# Patient Record
Sex: Female | Born: 1970 | Race: Black or African American | Hispanic: No | Marital: Married
Health system: Southern US, Community
[De-identification: ages and names within clinical notes are randomized; demographics above are authoritative.]

---

## 2015-10-30 ENCOUNTER — Inpatient Hospital Stay
Admission: RE | Admit: 2015-10-30 | Discharge: 2015-12-13 | Disposition: A | Discharge status: Rehabilitation Facility | Payer: Medicare Other | Attending: Internal Medicine | Admitting: Internal Medicine

## 2015-10-30 ENCOUNTER — Other Ambulatory Visit (HOSPITAL_COMMUNITY): Payer: Medicare Other

## 2015-10-30 DIAGNOSIS — Z452 Encounter for adjustment and management of vascular access device: Secondary | ICD-10-CM

## 2015-10-30 DIAGNOSIS — J9690 Respiratory failure, unspecified, unspecified whether with hypoxia or hypercapnia: Secondary | ICD-10-CM

## 2015-10-30 DIAGNOSIS — R509 Fever, unspecified: Secondary | ICD-10-CM

## 2015-10-30 DIAGNOSIS — T17908A Unspecified foreign body in respiratory tract, part unspecified causing other injury, initial encounter: Secondary | ICD-10-CM

## 2015-10-30 DIAGNOSIS — Z431 Encounter for attention to gastrostomy: Secondary | ICD-10-CM

## 2015-10-30 DIAGNOSIS — K567 Ileus, unspecified: Secondary | ICD-10-CM

## 2015-10-30 DIAGNOSIS — R1110 Vomiting, unspecified: Secondary | ICD-10-CM

## 2015-10-30 MED ORDER — DIATRIZOATE MEGLUMINE & SODIUM 66-10 % PO SOLN
30.0000 mL | Freq: Once | ORAL | Status: AC
  Administered 2015-10-30: 30 mL via ORAL

## 2015-10-31 ENCOUNTER — Other Ambulatory Visit (HOSPITAL_COMMUNITY): Payer: Medicare Other

## 2015-10-31 LAB — BLOOD GAS, ARTERIAL
ACID-BASE DEFICIT: 0.4 mmol/L (ref 0.0–2.0)
Acid-base deficit: 1 mmol/L (ref 0.0–2.0)
BICARBONATE: 25 mmol/L (ref 20.0–28.0)
Bicarbonate: 24.9 mmol/L (ref 20.0–28.0)
Drawn by: 290171
FIO2: 30
FIO2: 30
LHR: 14 {breaths}/min
MECHVT: 400 mL
MECHVT: 400 mL
O2 Saturation: 64.3 %
O2 Saturation: 90.5 %
PEEP/CPAP: 5 cmH2O
PEEP: 5 cmH2O
Patient temperature: 97
Patient temperature: 97
RATE: 14 resp/min
pCO2 arterial: 53 mmHg — ABNORMAL HIGH (ref 32.0–48.0)
pCO2 arterial: 48.2 mmHg — ABNORMAL HIGH (ref 32.0–48.0)
pH, Arterial: 7.288 — ABNORMAL LOW (ref 7.350–7.450)
pH, Arterial: 7.33 — ABNORMAL LOW (ref 7.350–7.450)
pO2, Arterial: 37.9 mmHg — CL (ref 83.0–108.0)
pO2, Arterial: 63.8 mmHg — ABNORMAL LOW (ref 83.0–108.0)

## 2015-10-31 LAB — COMPREHENSIVE METABOLIC PANEL
ALBUMIN: 2.7 g/dL — AB (ref 3.5–5.0)
ALK PHOS: 87 U/L (ref 38–126)
ALT: 52 U/L (ref 14–54)
ANION GAP: 11 (ref 5–15)
AST: 35 U/L (ref 15–41)
BUN: 24 mg/dL — ABNORMAL HIGH (ref 6–20)
CALCIUM: 9.1 mg/dL (ref 8.9–10.3)
CO2: 23 mmol/L (ref 22–32)
Chloride: 103 mmol/L (ref 101–111)
Creatinine, Ser: 1.29 mg/dL — ABNORMAL HIGH (ref 0.44–1.00)
GFR calc Af Amer: 57 mL/min — ABNORMAL LOW (ref >60)
GFR calc non Af Amer: 49 mL/min — ABNORMAL LOW (ref >60)
Glucose, Bld: 226 mg/dL — ABNORMAL HIGH (ref 65–99)
Potassium: 5 mmol/L (ref 3.5–5.1)
SODIUM: 137 mmol/L (ref 135–145)
Total Bilirubin: 0.8 mg/dL (ref 0.3–1.2)
Total Protein: 7.6 g/dL (ref 6.5–8.1)

## 2015-10-31 LAB — CBC
HCT: 31.3 % — ABNORMAL LOW (ref 36.0–46.0)
HEMOGLOBIN: 9.2 g/dL — AB (ref 12.0–15.0)
MCH: 25.8 pg — AB (ref 26.0–34.0)
MCHC: 29.4 g/dL — AB (ref 30.0–36.0)
MCV: 87.9 fL (ref 78.0–100.0)
Platelets: 248 10*3/uL (ref 150–400)
RBC: 3.56 MIL/uL — ABNORMAL LOW (ref 3.87–5.11)
RDW: 15.9 % — ABNORMAL HIGH (ref 11.5–15.5)
WBC: 11.5 10*3/uL — ABNORMAL HIGH (ref 4.0–10.5)

## 2015-11-01 LAB — C DIFFICILE QUICK SCREEN W PCR REFLEX
C DIFFICILE (CDIFF) TOXIN: NEGATIVE
C Diff antigen: NEGATIVE
C Diff interpretation: NOT DETECTED

## 2015-11-02 LAB — BASIC METABOLIC PANEL
Anion gap: 5 (ref 5–15)
BUN: 20 mg/dL (ref 6–20)
CALCIUM: 9.3 mg/dL (ref 8.9–10.3)
CHLORIDE: 104 mmol/L (ref 101–111)
CO2: 28 mmol/L (ref 22–32)
CREATININE: 1.12 mg/dL — AB (ref 0.44–1.00)
GFR, EST NON AFRICAN AMERICAN: 58 mL/min — AB (ref >60)
Glucose, Bld: 175 mg/dL — ABNORMAL HIGH (ref 65–99)
Potassium: 5.5 mmol/L — ABNORMAL HIGH (ref 3.5–5.1)
SODIUM: 137 mmol/L (ref 135–145)

## 2015-11-02 LAB — CBC WITH DIFFERENTIAL/PLATELET
BASOS PCT: 1 %
Basophils Absolute: 0 10*3/uL (ref 0.0–0.1)
EOS ABS: 0.2 10*3/uL (ref 0.0–0.7)
Eosinophils Relative: 2 %
HCT: 27.1 % — ABNORMAL LOW (ref 36.0–46.0)
HEMOGLOBIN: 8 g/dL — AB (ref 12.0–15.0)
Lymphocytes Relative: 23 %
Lymphs Abs: 1.5 10*3/uL (ref 0.7–4.0)
MCH: 26.1 pg (ref 26.0–34.0)
MCHC: 29.5 g/dL — AB (ref 30.0–36.0)
MCV: 88.6 fL (ref 78.0–100.0)
MONOS PCT: 7 %
Monocytes Absolute: 0.4 10*3/uL (ref 0.1–1.0)
NEUTROS PCT: 67 %
Neutro Abs: 4.3 10*3/uL (ref 1.7–7.7)
PLATELETS: 168 10*3/uL (ref 150–400)
RBC: 3.06 MIL/uL — ABNORMAL LOW (ref 3.87–5.11)
RDW: 15.8 % — ABNORMAL HIGH (ref 11.5–15.5)
WBC: 6.4 10*3/uL (ref 4.0–10.5)

## 2015-11-03 LAB — BASIC METABOLIC PANEL
Anion gap: 8 (ref 5–15)
BUN: 19 mg/dL (ref 6–20)
CALCIUM: 9.3 mg/dL (ref 8.9–10.3)
CHLORIDE: 102 mmol/L (ref 101–111)
CO2: 26 mmol/L (ref 22–32)
CREATININE: 1.06 mg/dL — AB (ref 0.44–1.00)
GFR calc Af Amer: >60 mL/min (ref >60)
GFR calc non Af Amer: >60 mL/min (ref >60)
GLUCOSE: 234 mg/dL — AB (ref 65–99)
Potassium: 5.8 mmol/L — ABNORMAL HIGH (ref 3.5–5.1)
Sodium: 136 mmol/L (ref 135–145)

## 2015-11-04 LAB — POTASSIUM: Potassium: 4.7 mmol/L (ref 3.5–5.1)

## 2015-11-06 ENCOUNTER — Other Ambulatory Visit (HOSPITAL_COMMUNITY): Payer: Medicare Other

## 2015-11-06 LAB — BASIC METABOLIC PANEL
Anion gap: 10 (ref 5–15)
BUN: 21 mg/dL — AB (ref 6–20)
CO2: 26 mmol/L (ref 22–32)
CREATININE: 1.11 mg/dL — AB (ref 0.44–1.00)
Calcium: 9.3 mg/dL (ref 8.9–10.3)
Chloride: 101 mmol/L (ref 101–111)
GFR, EST NON AFRICAN AMERICAN: 59 mL/min — AB (ref >60)
Glucose, Bld: 160 mg/dL — ABNORMAL HIGH (ref 65–99)
POTASSIUM: 5.4 mmol/L — AB (ref 3.5–5.1)
SODIUM: 137 mmol/L (ref 135–145)

## 2015-11-06 LAB — CBC
HCT: 33.3 % — ABNORMAL LOW (ref 36.0–46.0)
HEMOGLOBIN: 9.8 g/dL — AB (ref 12.0–15.0)
MCH: 26.2 pg (ref 26.0–34.0)
MCHC: 29.4 g/dL — AB (ref 30.0–36.0)
MCV: 89 fL (ref 78.0–100.0)
PLATELETS: 188 10*3/uL (ref 150–400)
RBC: 3.74 MIL/uL — AB (ref 3.87–5.11)
RDW: 15.9 % — ABNORMAL HIGH (ref 11.5–15.5)
WBC: 9.7 10*3/uL (ref 4.0–10.5)

## 2015-11-12 LAB — BASIC METABOLIC PANEL
Anion gap: 13 (ref 5–15)
BUN: 40 mg/dL — AB (ref 6–20)
CALCIUM: 9.6 mg/dL (ref 8.9–10.3)
CO2: 26 mmol/L (ref 22–32)
CREATININE: 1.45 mg/dL — AB (ref 0.44–1.00)
Chloride: 100 mmol/L — ABNORMAL LOW (ref 101–111)
GFR calc Af Amer: 50 mL/min — ABNORMAL LOW (ref >60)
GFR, EST NON AFRICAN AMERICAN: 43 mL/min — AB (ref >60)
GLUCOSE: 109 mg/dL — AB (ref 65–99)
Potassium: 4.9 mmol/L (ref 3.5–5.1)
SODIUM: 139 mmol/L (ref 135–145)

## 2015-11-12 LAB — CBC
HCT: 35.4 % — ABNORMAL LOW (ref 36.0–46.0)
Hemoglobin: 10.5 g/dL — ABNORMAL LOW (ref 12.0–15.0)
MCH: 25.7 pg — AB (ref 26.0–34.0)
MCHC: 29.7 g/dL — AB (ref 30.0–36.0)
MCV: 86.6 fL (ref 78.0–100.0)
PLATELETS: 225 10*3/uL (ref 150–400)
RBC: 4.09 MIL/uL (ref 3.87–5.11)
RDW: 15.6 % — AB (ref 11.5–15.5)
WBC: 7.5 10*3/uL (ref 4.0–10.5)

## 2015-11-15 LAB — URINALYSIS, ROUTINE W REFLEX MICROSCOPIC
Bilirubin Urine: NEGATIVE
Glucose, UA: NEGATIVE mg/dL
HGB URINE DIPSTICK: NEGATIVE
Ketones, ur: NEGATIVE mg/dL
Nitrite: NEGATIVE
Protein, ur: 30 mg/dL — AB
SPECIFIC GRAVITY, URINE: 1.017 (ref 1.005–1.030)
pH: 6 (ref 5.0–8.0)

## 2015-11-15 LAB — URINE MICROSCOPIC-ADD ON

## 2015-11-16 LAB — BASIC METABOLIC PANEL
Anion gap: 10 (ref 5–15)
BUN: 40 mg/dL — AB (ref 6–20)
CHLORIDE: 101 mmol/L (ref 101–111)
CO2: 30 mmol/L (ref 22–32)
CREATININE: 1.39 mg/dL — AB (ref 0.44–1.00)
Calcium: 9.5 mg/dL (ref 8.9–10.3)
GFR calc Af Amer: 52 mL/min — ABNORMAL LOW (ref >60)
GFR calc non Af Amer: 45 mL/min — ABNORMAL LOW (ref >60)
Glucose, Bld: 174 mg/dL — ABNORMAL HIGH (ref 65–99)
Potassium: 4.7 mmol/L (ref 3.5–5.1)
SODIUM: 141 mmol/L (ref 135–145)

## 2015-11-16 LAB — CBC
HCT: 32.6 % — ABNORMAL LOW (ref 36.0–46.0)
Hemoglobin: 9.7 g/dL — ABNORMAL LOW (ref 12.0–15.0)
MCH: 25.9 pg — AB (ref 26.0–34.0)
MCHC: 29.8 g/dL — ABNORMAL LOW (ref 30.0–36.0)
MCV: 87.2 fL (ref 78.0–100.0)
PLATELETS: 192 10*3/uL (ref 150–400)
RBC: 3.74 MIL/uL — ABNORMAL LOW (ref 3.87–5.11)
RDW: 15.2 % (ref 11.5–15.5)
WBC: 8.2 10*3/uL (ref 4.0–10.5)

## 2015-11-20 ENCOUNTER — Other Ambulatory Visit (HOSPITAL_COMMUNITY): Payer: Medicare Other

## 2015-11-21 LAB — BASIC METABOLIC PANEL WITH GFR
Anion gap: 10 (ref 5–15)
BUN: 39 mg/dL — ABNORMAL HIGH (ref 6–20)
CO2: 31 mmol/L (ref 22–32)
Calcium: 9.6 mg/dL (ref 8.9–10.3)
Chloride: 103 mmol/L (ref 101–111)
Creatinine, Ser: 1.59 mg/dL — ABNORMAL HIGH (ref 0.44–1.00)
GFR calc Af Amer: 44 mL/min — ABNORMAL LOW
GFR calc non Af Amer: 38 mL/min — ABNORMAL LOW
Glucose, Bld: 105 mg/dL — ABNORMAL HIGH (ref 65–99)
Potassium: 5.1 mmol/L (ref 3.5–5.1)
Sodium: 144 mmol/L (ref 135–145)

## 2015-11-21 LAB — CBC
HCT: 37 % (ref 36.0–46.0)
HEMOGLOBIN: 10.7 g/dL — AB (ref 12.0–15.0)
MCH: 25.5 pg — AB (ref 26.0–34.0)
MCHC: 28.9 g/dL — ABNORMAL LOW (ref 30.0–36.0)
MCV: 88.1 fL (ref 78.0–100.0)
Platelets: 188 10*3/uL (ref 150–400)
RBC: 4.2 MIL/uL (ref 3.87–5.11)
RDW: 15.5 % (ref 11.5–15.5)
WBC: 8.3 10*3/uL (ref 4.0–10.5)

## 2015-11-24 ENCOUNTER — Other Ambulatory Visit (HOSPITAL_COMMUNITY): Payer: Medicare Other

## 2015-11-24 LAB — URINALYSIS, ROUTINE W REFLEX MICROSCOPIC
Bilirubin Urine: NEGATIVE
GLUCOSE, UA: NEGATIVE mg/dL
Hgb urine dipstick: NEGATIVE
Ketones, ur: NEGATIVE mg/dL
NITRITE: NEGATIVE
PROTEIN: 100 mg/dL — AB
Specific Gravity, Urine: 1.019 (ref 1.005–1.030)
pH: 6.5 (ref 5.0–8.0)

## 2015-11-24 LAB — BASIC METABOLIC PANEL
ANION GAP: 8 (ref 5–15)
BUN: 45 mg/dL — ABNORMAL HIGH (ref 6–20)
CHLORIDE: 103 mmol/L (ref 101–111)
CO2: 33 mmol/L — AB (ref 22–32)
CREATININE: 1.78 mg/dL — AB (ref 0.44–1.00)
Calcium: 9.8 mg/dL (ref 8.9–10.3)
GFR calc non Af Amer: 33 mL/min — ABNORMAL LOW (ref >60)
GFR, EST AFRICAN AMERICAN: 39 mL/min — AB (ref >60)
Glucose, Bld: 241 mg/dL — ABNORMAL HIGH (ref 65–99)
POTASSIUM: 6.2 mmol/L — AB (ref 3.5–5.1)
Sodium: 144 mmol/L (ref 135–145)

## 2015-11-24 LAB — CBC
HEMATOCRIT: 35.7 % — AB (ref 36.0–46.0)
HEMOGLOBIN: 10.6 g/dL — AB (ref 12.0–15.0)
MCH: 26.4 pg (ref 26.0–34.0)
MCHC: 29.7 g/dL — AB (ref 30.0–36.0)
MCV: 88.8 fL (ref 78.0–100.0)
Platelets: 208 10*3/uL (ref 150–400)
RBC: 4.02 MIL/uL (ref 3.87–5.11)
RDW: 16 % — ABNORMAL HIGH (ref 11.5–15.5)
WBC: 11.5 10*3/uL — ABNORMAL HIGH (ref 4.0–10.5)

## 2015-11-24 LAB — URINE MICROSCOPIC-ADD ON

## 2015-11-24 LAB — PROCALCITONIN

## 2015-11-25 LAB — BASIC METABOLIC PANEL
Anion gap: 13 (ref 5–15)
BUN: 43 mg/dL — AB (ref 6–20)
CALCIUM: 9.5 mg/dL (ref 8.9–10.3)
CO2: 26 mmol/L (ref 22–32)
CREATININE: 1.45 mg/dL — AB (ref 0.44–1.00)
Chloride: 106 mmol/L (ref 101–111)
GFR calc non Af Amer: 43 mL/min — ABNORMAL LOW (ref >60)
GFR, EST AFRICAN AMERICAN: 50 mL/min — AB (ref >60)
Glucose, Bld: 202 mg/dL — ABNORMAL HIGH (ref 65–99)
Potassium: 5 mmol/L (ref 3.5–5.1)
SODIUM: 145 mmol/L (ref 135–145)

## 2015-11-26 ENCOUNTER — Other Ambulatory Visit (HOSPITAL_COMMUNITY): Payer: Medicare Other

## 2015-11-26 LAB — URINE CULTURE

## 2015-11-26 LAB — PROCALCITONIN

## 2015-11-28 ENCOUNTER — Other Ambulatory Visit (HOSPITAL_COMMUNITY): Payer: Medicare Other

## 2015-11-28 LAB — URINE MICROSCOPIC-ADD ON: RBC / HPF: NONE SEEN RBC/hpf (ref 0–5)

## 2015-11-28 LAB — URINALYSIS, ROUTINE W REFLEX MICROSCOPIC
BILIRUBIN URINE: NEGATIVE
Glucose, UA: NEGATIVE mg/dL
Hgb urine dipstick: NEGATIVE
Ketones, ur: NEGATIVE mg/dL
NITRITE: NEGATIVE
PH: 5 (ref 5.0–8.0)
Protein, ur: 100 mg/dL — AB
SPECIFIC GRAVITY, URINE: 1.021 (ref 1.005–1.030)

## 2015-11-28 LAB — PROCALCITONIN

## 2015-11-29 LAB — CBC WITH DIFFERENTIAL/PLATELET
Basophils Absolute: 0.1 10*3/uL (ref 0.0–0.1)
Basophils Relative: 1 %
EOS PCT: 4 %
Eosinophils Absolute: 0.4 10*3/uL (ref 0.0–0.7)
HCT: 35.5 % — ABNORMAL LOW (ref 36.0–46.0)
HEMOGLOBIN: 10.3 g/dL — AB (ref 12.0–15.0)
LYMPHS PCT: 38 %
Lymphs Abs: 3.5 10*3/uL (ref 0.7–4.0)
MCH: 25.8 pg — AB (ref 26.0–34.0)
MCHC: 29 g/dL — AB (ref 30.0–36.0)
MCV: 88.8 fL (ref 78.0–100.0)
MONOS PCT: 7 %
Monocytes Absolute: 0.6 10*3/uL (ref 0.1–1.0)
NEUTROS PCT: 51 %
Neutro Abs: 4.8 10*3/uL (ref 1.7–7.7)
Platelets: 208 10*3/uL (ref 150–400)
RBC: 4 MIL/uL (ref 3.87–5.11)
RDW: 16.3 % — ABNORMAL HIGH (ref 11.5–15.5)
WBC: 9.3 10*3/uL (ref 4.0–10.5)

## 2015-11-29 LAB — BLOOD CULTURE ID PANEL (REFLEXED)
Acinetobacter baumannii: NOT DETECTED
CANDIDA GLABRATA: NOT DETECTED
CANDIDA KRUSEI: NOT DETECTED
CANDIDA PARAPSILOSIS: NOT DETECTED
CANDIDA TROPICALIS: NOT DETECTED
Candida albicans: NOT DETECTED
ENTEROBACTER CLOACAE COMPLEX: NOT DETECTED
ESCHERICHIA COLI: NOT DETECTED
Enterobacteriaceae species: NOT DETECTED
Enterococcus species: NOT DETECTED
Haemophilus influenzae: NOT DETECTED
KLEBSIELLA PNEUMONIAE: NOT DETECTED
Klebsiella oxytoca: NOT DETECTED
Listeria monocytogenes: NOT DETECTED
Methicillin resistance: DETECTED — AB
Neisseria meningitidis: NOT DETECTED
PROTEUS SPECIES: NOT DETECTED
Pseudomonas aeruginosa: NOT DETECTED
SERRATIA MARCESCENS: NOT DETECTED
STAPHYLOCOCCUS SPECIES: DETECTED — AB
Staphylococcus aureus (BCID): NOT DETECTED
Streptococcus agalactiae: NOT DETECTED
Streptococcus pneumoniae: NOT DETECTED
Streptococcus pyogenes: NOT DETECTED
Streptococcus species: NOT DETECTED

## 2015-11-29 LAB — URINE CULTURE: Culture: NO GROWTH

## 2015-11-30 LAB — VANCOMYCIN, TROUGH: VANCOMYCIN TR: 21 ug/mL — AB (ref 15–20)

## 2015-12-01 ENCOUNTER — Other Ambulatory Visit (HOSPITAL_COMMUNITY): Payer: Medicare Other

## 2015-12-01 LAB — CULTURE, BLOOD (ROUTINE X 2)

## 2015-12-02 LAB — CULTURE, RESPIRATORY

## 2015-12-02 LAB — CULTURE, RESPIRATORY W GRAM STAIN

## 2015-12-02 LAB — BLOOD CULTURE ID PANEL (REFLEXED)
Acinetobacter baumannii: NOT DETECTED
CANDIDA ALBICANS: NOT DETECTED
CANDIDA GLABRATA: NOT DETECTED
CANDIDA PARAPSILOSIS: NOT DETECTED
CANDIDA TROPICALIS: NOT DETECTED
Candida krusei: NOT DETECTED
ENTEROBACTER CLOACAE COMPLEX: NOT DETECTED
ENTEROBACTERIACEAE SPECIES: NOT DETECTED
Enterococcus species: NOT DETECTED
Escherichia coli: NOT DETECTED
Haemophilus influenzae: NOT DETECTED
KLEBSIELLA OXYTOCA: NOT DETECTED
KLEBSIELLA PNEUMONIAE: NOT DETECTED
Listeria monocytogenes: NOT DETECTED
Methicillin resistance: DETECTED — AB
Neisseria meningitidis: NOT DETECTED
PROTEUS SPECIES: NOT DETECTED
Pseudomonas aeruginosa: NOT DETECTED
STAPHYLOCOCCUS SPECIES: DETECTED — AB
STREPTOCOCCUS PYOGENES: NOT DETECTED
Serratia marcescens: NOT DETECTED
Staphylococcus aureus (BCID): NOT DETECTED
Streptococcus agalactiae: NOT DETECTED
Streptococcus pneumoniae: NOT DETECTED
Streptococcus species: NOT DETECTED

## 2015-12-03 ENCOUNTER — Ambulatory Visit (HOSPITAL_COMMUNITY): Payer: Medicare Other

## 2015-12-03 LAB — CULTURE, BLOOD (ROUTINE X 2)

## 2015-12-03 LAB — VANCOMYCIN, TROUGH: Vancomycin Tr: 37 ug/mL (ref 15–20)

## 2015-12-04 ENCOUNTER — Other Ambulatory Visit (HOSPITAL_BASED_OUTPATIENT_CLINIC_OR_DEPARTMENT_OTHER): Payer: Medicare Other

## 2015-12-04 DIAGNOSIS — I339 Acute and subacute endocarditis, unspecified: Secondary | ICD-10-CM | POA: Diagnosis not present

## 2015-12-04 LAB — CBC
HEMATOCRIT: 34.4 % — AB (ref 36.0–46.0)
HEMOGLOBIN: 10.1 g/dL — AB (ref 12.0–15.0)
MCH: 26.2 pg (ref 26.0–34.0)
MCHC: 29.4 g/dL — AB (ref 30.0–36.0)
MCV: 89.4 fL (ref 78.0–100.0)
Platelets: 178 10*3/uL (ref 150–400)
RBC: 3.85 MIL/uL — ABNORMAL LOW (ref 3.87–5.11)
RDW: 17.1 % — ABNORMAL HIGH (ref 11.5–15.5)
WBC: 10.7 10*3/uL — ABNORMAL HIGH (ref 4.0–10.5)

## 2015-12-04 LAB — BASIC METABOLIC PANEL
Anion gap: 8 (ref 5–15)
BUN: 52 mg/dL — AB (ref 6–20)
CHLORIDE: 106 mmol/L (ref 101–111)
CO2: 29 mmol/L (ref 22–32)
CREATININE: 1.47 mg/dL — AB (ref 0.44–1.00)
Calcium: 9.4 mg/dL (ref 8.9–10.3)
GFR calc non Af Amer: 42 mL/min — ABNORMAL LOW (ref >60)
GFR, EST AFRICAN AMERICAN: 49 mL/min — AB (ref >60)
Glucose, Bld: 167 mg/dL — ABNORMAL HIGH (ref 65–99)
Potassium: 4.8 mmol/L (ref 3.5–5.1)
Sodium: 143 mmol/L (ref 135–145)

## 2015-12-04 LAB — VANCOMYCIN, TROUGH: Vancomycin Tr: 20 ug/mL (ref 15–20)

## 2015-12-04 NOTE — Progress Notes (Signed)
  Echocardiogram 2D Echocardiogram limited has been performed.  Nolon RodBrown, Kim Barron, Kim Barron

## 2015-12-06 LAB — CULTURE, BLOOD (ROUTINE X 2): Culture: NO GROWTH

## 2015-12-07 LAB — CBC WITH DIFFERENTIAL/PLATELET
BASOS PCT: 1 %
Basophils Absolute: 0.1 10*3/uL (ref 0.0–0.1)
Eosinophils Absolute: 0.6 10*3/uL (ref 0.0–0.7)
Eosinophils Relative: 5 %
HCT: 35.2 % — ABNORMAL LOW (ref 36.0–46.0)
HEMOGLOBIN: 10.4 g/dL — AB (ref 12.0–15.0)
LYMPHS PCT: 31 %
Lymphs Abs: 4 10*3/uL (ref 0.7–4.0)
MCH: 26.9 pg (ref 26.0–34.0)
MCHC: 29.5 g/dL — ABNORMAL LOW (ref 30.0–36.0)
MCV: 91 fL (ref 78.0–100.0)
MONOS PCT: 8 %
Monocytes Absolute: 1 10*3/uL (ref 0.1–1.0)
NEUTROS ABS: 7.1 10*3/uL (ref 1.7–7.7)
Neutrophils Relative %: 55 %
Platelets: 233 10*3/uL (ref 150–400)
RBC: 3.87 MIL/uL (ref 3.87–5.11)
RDW: 16.8 % — ABNORMAL HIGH (ref 11.5–15.5)
WBC: 12.8 10*3/uL — ABNORMAL HIGH (ref 4.0–10.5)

## 2015-12-07 LAB — BASIC METABOLIC PANEL
ANION GAP: 8 (ref 5–15)
BUN: 50 mg/dL — AB (ref 6–20)
CHLORIDE: 109 mmol/L (ref 101–111)
CO2: 30 mmol/L (ref 22–32)
Calcium: 9.4 mg/dL (ref 8.9–10.3)
Creatinine, Ser: 1.61 mg/dL — ABNORMAL HIGH (ref 0.44–1.00)
GFR calc Af Amer: 44 mL/min — ABNORMAL LOW (ref >60)
GFR calc non Af Amer: 38 mL/min — ABNORMAL LOW (ref >60)
GLUCOSE: 147 mg/dL — AB (ref 65–99)
POTASSIUM: 5.3 mmol/L — AB (ref 3.5–5.1)
SODIUM: 147 mmol/L — AB (ref 135–145)

## 2015-12-08 LAB — BASIC METABOLIC PANEL
ANION GAP: 8 (ref 5–15)
BUN: 50 mg/dL — AB (ref 6–20)
CHLORIDE: 103 mmol/L (ref 101–111)
CO2: 29 mmol/L (ref 22–32)
Calcium: 8.7 mg/dL — ABNORMAL LOW (ref 8.9–10.3)
Creatinine, Ser: 1.59 mg/dL — ABNORMAL HIGH (ref 0.44–1.00)
GFR, EST AFRICAN AMERICAN: 44 mL/min — AB (ref >60)
GFR, EST NON AFRICAN AMERICAN: 38 mL/min — AB (ref >60)
Glucose, Bld: 295 mg/dL — ABNORMAL HIGH (ref 65–99)
Potassium: 4.9 mmol/L (ref 3.5–5.1)
SODIUM: 140 mmol/L (ref 135–145)

## 2015-12-08 LAB — VANCOMYCIN, TROUGH: VANCOMYCIN TR: 23 ug/mL — AB (ref 15–20)

## 2015-12-09 LAB — CULTURE, BLOOD (ROUTINE X 2)
CULTURE: NO GROWTH
CULTURE: NO GROWTH

## 2015-12-09 LAB — VANCOMYCIN, TROUGH: Vancomycin Tr: 15 ug/mL (ref 15–20)

## 2015-12-10 ENCOUNTER — Other Ambulatory Visit: Payer: Self-pay | Admitting: Physician Assistant

## 2015-12-10 NOTE — Progress Notes (Signed)
    [facility] has been requested to perform a transesophageal echocardiogram on Kim Barron for bacteremia and abnormality on AV on TTE scheduled for 12/11/15 @ 3pm .  After careful review of history and examination, the risks and benefits of transesophageal echocardiogram have been explained including risks of esophageal damage, perforation (1:10,000 risk), bleeding, pharyngeal hematoma as well as other potential complications associated with conscious sedation including aspiration, arrhythmia, respiratory failure and death. Alternatives to treatment were discussed, questions were answered. Discssed with her husband Kim Barron and he is willing to proceed.   Cline CrockKathryn Connor Foxworthy, PA-C  12/10/2015 12:27 PM

## 2015-12-11 ENCOUNTER — Other Ambulatory Visit (HOSPITAL_COMMUNITY): Payer: Medicare Other

## 2015-12-11 ENCOUNTER — Encounter
Admission: RE | Disposition: A | Discharge status: Rehabilitation Facility | Payer: Self-pay | Source: Home / Self Care | Attending: Internal Medicine

## 2015-12-11 ENCOUNTER — Other Ambulatory Visit (HOSPITAL_BASED_OUTPATIENT_CLINIC_OR_DEPARTMENT_OTHER): Payer: Medicare Other

## 2015-12-11 DIAGNOSIS — R7881 Bacteremia: Secondary | ICD-10-CM

## 2015-12-11 LAB — BASIC METABOLIC PANEL
Anion gap: 8 (ref 5–15)
BUN: 43 mg/dL — AB (ref 6–20)
CALCIUM: 9.2 mg/dL (ref 8.9–10.3)
CHLORIDE: 102 mmol/L (ref 101–111)
CO2: 30 mmol/L (ref 22–32)
CREATININE: 1.41 mg/dL — AB (ref 0.44–1.00)
GFR, EST AFRICAN AMERICAN: 51 mL/min — AB (ref >60)
GFR, EST NON AFRICAN AMERICAN: 44 mL/min — AB (ref >60)
Glucose, Bld: 139 mg/dL — ABNORMAL HIGH (ref 65–99)
Potassium: 4.3 mmol/L (ref 3.5–5.1)
SODIUM: 140 mmol/L (ref 135–145)

## 2015-12-11 LAB — CBC
HCT: 32.1 % — ABNORMAL LOW (ref 36.0–46.0)
HEMOGLOBIN: 9.7 g/dL — AB (ref 12.0–15.0)
MCH: 26.1 pg (ref 26.0–34.0)
MCHC: 30.2 g/dL (ref 30.0–36.0)
MCV: 86.5 fL (ref 78.0–100.0)
PLATELETS: 160 10*3/uL (ref 150–400)
RBC: 3.71 MIL/uL — ABNORMAL LOW (ref 3.87–5.11)
RDW: 16.4 % — ABNORMAL HIGH (ref 11.5–15.5)
WBC: 9 10*3/uL (ref 4.0–10.5)

## 2015-12-11 LAB — PROTIME-INR
INR: 1.02
PROTHROMBIN TIME: 13.4 s (ref 11.4–15.2)

## 2015-12-11 SURGERY — ECHOCARDIOGRAM, TRANSESOPHAGEAL
Anesthesia: Moderate Sedation

## 2015-12-11 NOTE — CV Procedure (Signed)
Brief TEE Note  LVEF 55-60% No LA or LAA thrombus or mass There was moderate thickening and calcification of the non-coronary cusp of the aortic valve.  There were no clear vegetations, no aortic stenosis or regurgitation.  For additional details see full report.  During this procedure the patient is administered a total of Versed 3 mg and Fentanyl 75 mcg to achieve and maintain moderate conscious sedation.  The patient's heart rate, blood pressure, and oxygen saturation are monitored continuously during the procedure. The period of conscious sedation is 20 minutes, of which I was present face-to-face 100% of this time.   Teniola Tseng C. Duke Salviaandolph, MD, [facility] CenterFACC  12/11/2015  4:40 PM

## 2015-12-11 NOTE — H&P (Signed)
Patient ID: Kim Barron MRN: [id number], DOB/AGE: 45/01/1970   Admit date: 10/30/2015  Requesting Physician: Dr. Sharyon MedicusHijazi Primary Physician: No primary care provider on file. Primary Cardiologist: None Reason for admission: anoxic brain injury- cardiology consulted for TEE for possible endocarditis  Pt. Profile:  Kim Barron is a 45 y.o. female with a history of DiGeorge syndrome, diabetes mellitus, morbid obesity, hypertension, OSA who was found unresponsive in the setting of severe hypoglycemia and acute respiratory failure with a question of anoxic brain injury.  The patient was treated for Klebsiella/Serratia pneumonia then treated for aspiration pneumonia she continued to be encephalopathic and developed a low-grade fever she was transferred to evaluation and treatment.  He has been ventilated and getting tube feeds while here. She's developed MRSA bacteremia nd has been treated with vancomycin and Zosyn. Transthoracic echocardiogram on 11/30 showed normal LV systolic function with a thickened non-coronary cusp of the aortic valve that was suspicious for endocarditis. Cardiology was consult for TEE to rule out endocarditis.    Problem List  No past medical history on file.  No past surgical history on file.   Allergies  Allergies not on file   Home Medications  Prior to Admission medications   Not on File    Family History  No family history on file.   No family history able to be obtained since patient is not alert and oriented No family status information on file.     Social History  Social History   Social History  . Marital status: Married    Spouse name: N/A  . Number of children: N/A  . Years of education: N/A   Occupational History  . Not on file.   Social History Main Topics  . Smoking status: Not on file  . Smokeless tobacco: Not on file  . Alcohol use Not on file  . Drug use: Unknown  . Sexual activity: Not on file   Other Topics  Concern  . Not on file   Social History Narrative  . No narrative on file     Review of Systems General:  No chills, fever, night sweats or weight changes.  Cardiovascular:  No chest pain, dyspnea on exertion, edema, orthopnea, palpitations, paroxysmal nocturnal dyspnea. Dermatological: No rash, lesions/masses Respiratory: No cough, dyspnea Urologic: No hematuria, dysuria Abdominal:   No nausea, vomiting, diarrhea, bright red blood per rectum, melena, or hematemesis Neurologic:  No visual changes, wkns, changes in mental status. All other systems reviewed and are otherwise negative except as noted above.  Physical Exam  There were no vitals taken for this visit.  General: intubated and sedated Psych: Normal affect. Neuro: Alert and oriented X 3. Moves all extremities spontaneously. HEENT: Normal  Neck: Supple without bruits or JVD. Lungs:  Resp regular and unlabored, CTA. Heart: RRR no s3, s4, or murmurs. Abdomen: Soft, non-tender, non-distended, BS + x 4.  Extremities: No clubbing, cyanosis or edema. DP/PT/Radials 2+ and equal bilaterally.  Labs  No results for input(s): CKTOTAL, CKMB, TROPONINI in the last 72 hours. Lab Results  Component Value Date   WBC 9.0 12/11/2015   HGB 9.7 (L) 12/11/2015   HCT 32.1 (L) 12/11/2015   MCV 86.5 12/11/2015   PLT 160 12/11/2015    Recent Labs Lab 12/11/15 0640  NA 140  K 4.3  CL 102  CO2 30  BUN 43*  CREATININE 1.41*  CALCIUM 9.2  GLUCOSE 139*   No results found for: CHOL, HDL, LDLCALC, TRIG  No results found for: DDIMER   Radiology/Studies  Dg Abd 1 View  Result Date: 11/20/2015 CLINICAL DATA:  Vomiting EXAM: ABDOMEN - 1 VIEW COMPARISON:  10/30/2015 FINDINGS: Gastrostomy tube overlying the stomach. Nonobstructive bowel gas pattern. Visualized osseous structures are within normal limits. IMPRESSION: Gastrostomy tube overlying the stomach. Nonobstructive bowel gas pattern. Electronically Signed   By: Charline BillsSriyesh  Krishnan  M.D.   On: 11/20/2015 10:54   Dg Chest Port 1 View  Result Date: 12/11/2015 CLINICAL DATA:  Respiratory failure, hypoxia EXAM: PORTABLE CHEST 1 VIEW COMPARISON:  Portable chest x-ray of 12/01/2015 and 11/28/2015 FINDINGS: The lungs are not as well aerated with basilar volume loss. Linear atelectasis or scarring is noted in the left mid lung. Mild pulmonary vascular congestion cannot be excluded. The heart is mildly enlarged. Left PICC line tip overlies the lower SVC and tracheostomy is noted. IMPRESSION: 1. Diminished aeration with increasing atelectasis bilaterally. 2. Cardiomegaly.  Cannot exclude mild pulmonary vascular congestion. 3. No change in tracheostomy and left PICC line. Electronically Signed   By: Dwyane DeePaul  Barry M.D.   On: 12/11/2015 08:07   Dg Chest Port 1 View  Result Date: 11/28/2015 CLINICAL DATA:  Fever EXAM: PORTABLE CHEST 1 VIEW COMPARISON:  11/24/2015 FINDINGS: A tracheostomy tube remains in place. Subsegmental atelectasis in the left upper lobe unchanged. Small focal opacity medial left lung base, atelectasis versus mild infiltrate. No effusion. Stable moderate cardiomegaly without overt failure. IMPRESSION: 1. Cardiomegaly without overt failure 2. Small focal opacity in the medial left lung base could reflect atelectasis or a small infiltrate. 3. Stable subsegmental atelectasis in the left upper lung. Electronically Signed   By: Jasmine PangKim  Fujinaga M.D.   On: 11/28/2015 15:09   Dg Chest Port 1 View  Result Date: 11/24/2015 CLINICAL DATA:  Respiratory failure. EXAM: PORTABLE CHEST 1 VIEW COMPARISON:  11/20/2015 . FINDINGS: Tracheostomy tube in stable position. Cardiomegaly. Left mid lung field subsegmental atelectasis. Low lung volumes. Very mild interstitial prominence noted. Very mild component congestive heart failure cannot be excluded. No pleural effusion or pneumothorax . IMPRESSION: 1. Tracheostomy tube in stable position. 2. Left mid lung field subsegmental atelectasis. 3.  Persistent cardiomegaly. Interim slight increase in interstitial prominence suggesting mild congestive heart failure. Electronically Signed   By: Maisie Fushomas  Register   On: 11/24/2015 07:43   Dg Chest Port 1 View  Result Date: 11/20/2015 CLINICAL DATA:  Tracheostomy EXAM: PORTABLE CHEST 1 VIEW COMPARISON:  11/06/2015 FINDINGS: Tracheostomy in satisfactory position. Mild platelike opacity in the left upper lobe, likely atelectasis. Mild patchy lateral right lower lobe opacity, atelectasis versus pneumonia. No pleural effusion or pneumothorax. Cardiomegaly. IMPRESSION: Mild patchy right lower lobe opacity, atelectasis versus pneumonia. Mild platelike opacity in the left upper lobe, likely atelectasis. Tracheostomy in satisfactory position. Electronically Signed   By: Charline BillsSriyesh  Krishnan M.D.   On: 11/20/2015 11:20   Dg Chest Port 1v Same Day  Result Date: 12/01/2015 CLINICAL DATA:  PICC line placement EXAM: PORTABLE CHEST 1 VIEW COMPARISON:  11/28/2015 FINDINGS: AP portable view of the chest demonstrates a tracheostomy tube. There is interim insertion of a left upper extremity catheter, the tip overlies the expected location of cavoatrial junction. There are low lung volumes augments Ing the cardiomediastinal silhouette and central bronchovascular structures. Perihilar opacities persist allowing for low lung volume. Heart appears enlarged. Discoid atelectasis in the left upper lobe is unchanged. No effusion. IMPRESSION: 1. Left upper extremity catheter tip projects over expected location of cavoatrial junction. 2. Low lung volumes. Cardiomegaly with perihilar  opacities, not significantly changed. Electronically Signed   By: Jasmine Pang M.D.   On: 12/01/2015 23:32   Dg Abd Portable 1v  Result Date: 11/26/2015 CLINICAL DATA:  Vomiting. EXAM: PORTABLE ABDOMEN - 1 VIEW COMPARISON:  None. FINDINGS: The bowel gas pattern is normal. No radio-opaque calculi or other significant radiographic abnormality are seen.  IMPRESSION: No evidence of bowel obstruction or ileus. Electronically Signed   By: Lupita Raider, M.D.   On: 11/26/2015 13:50       ASSESSMENT AND PLAN  Bacteremia with aortic valve abnormality on TTE: plan will be to do a TEE today to rule out endocarditis to determine length of antibiotic treatment. She would not be a surgical candidate given that she has no meaningful neurologic activity at this time.   Signed, Cline Crock, PA-C 12/11/2015, 3:15 PM  Pager [phone]

## 2015-12-12 LAB — VANCOMYCIN, TROUGH: Vancomycin Tr: 23 ug/mL (ref 15–20)

## 2016-02-05 DEATH — deceased

## 2018-09-15 IMAGING — CR DG ABDOMEN 1V
1 series · 1 of 1 positions shown · non-contrast
Comparison: None.

CLINICAL DATA: Peg tube placement with 30 cc of Gastrografin

EXAM:
ABDOMEN - 1 VIEW

[AP]
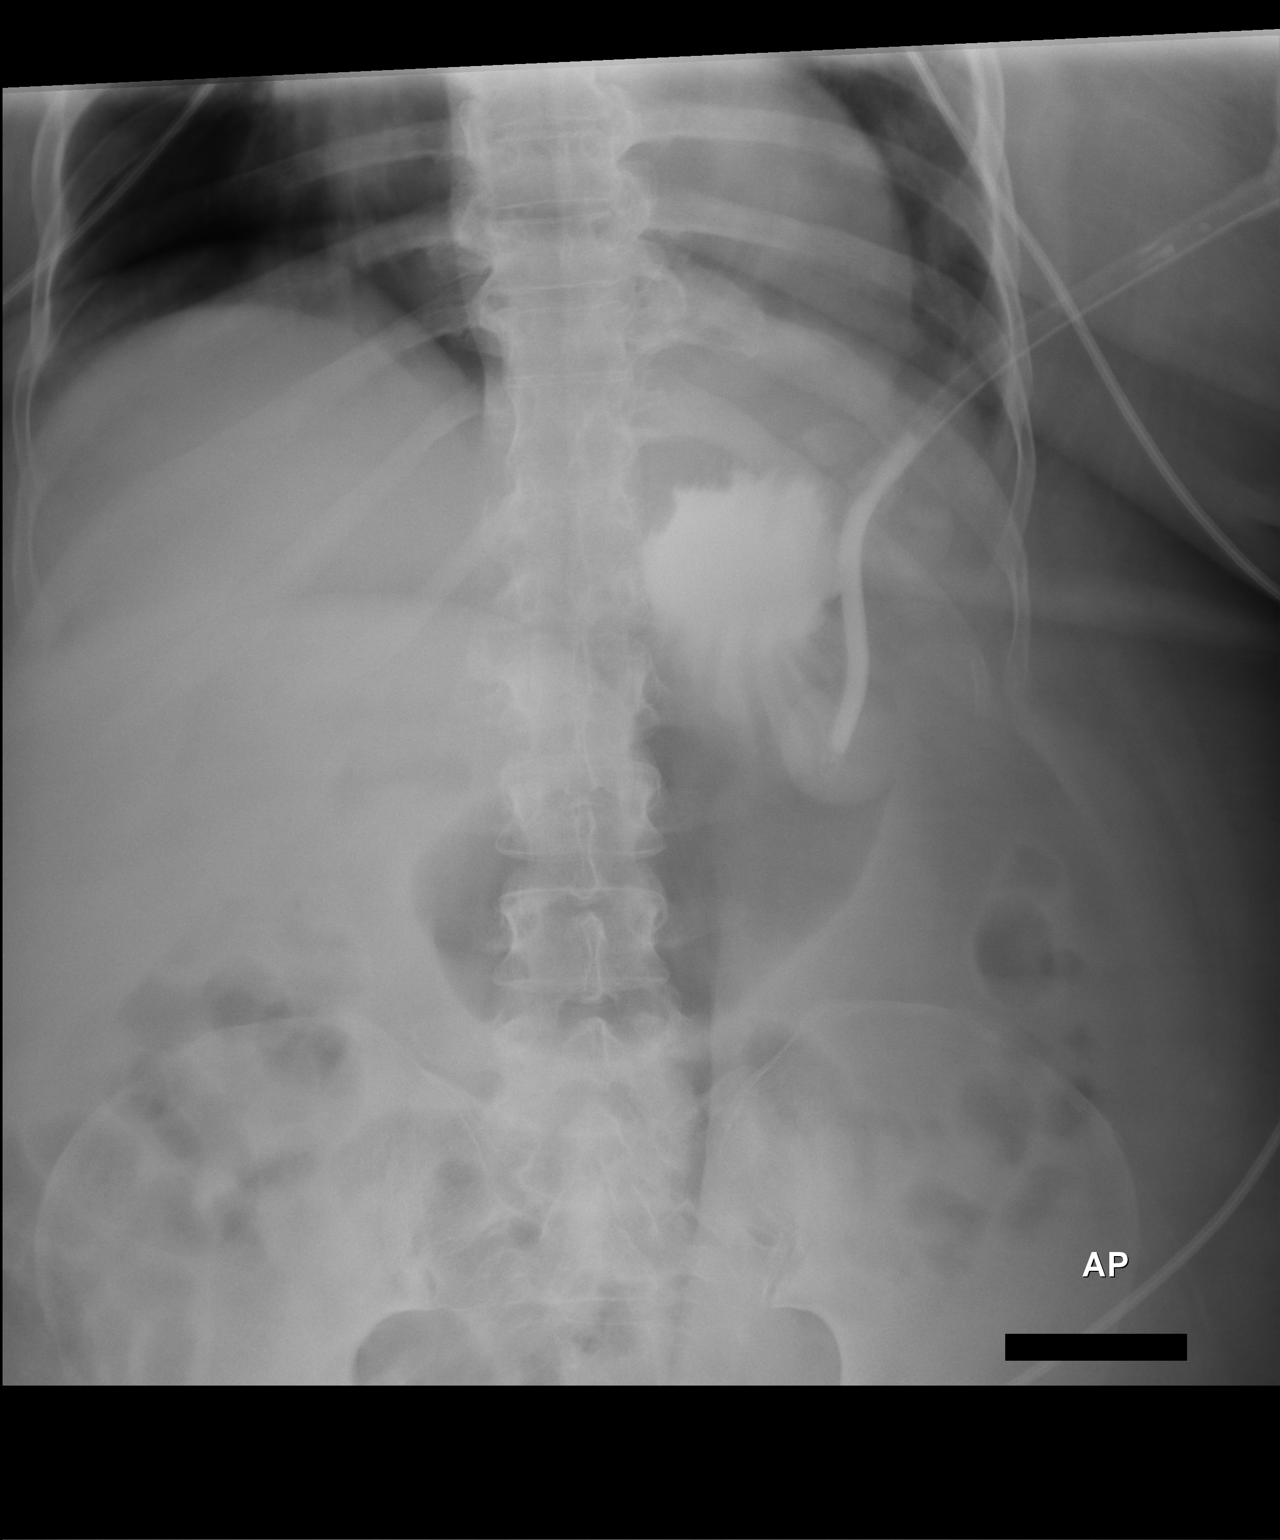

[1 of 1 positions shown; findings below may reference images not displayed]

FINDINGS: A small amount of contrast material is visualized within the
proximal stomach, consistent with intragastric location. There is
respiratory motion artifact. Remaining bowel gas pattern is
nonspecific.
IMPRESSION: Nonspecific gas pattern. Intragastric location of injected contrast.
No gross extravasation.
